# Patient Record
Sex: Female | Born: 1960 | Race: White | Hispanic: No | Marital: Married | Smoking: Never smoker
Health system: Southern US, Community
[De-identification: ages and names within clinical notes are randomized; demographics above are authoritative.]

---

## 2002-03-03 DIAGNOSIS — D229 Melanocytic nevi, unspecified: Secondary | ICD-10-CM

## 2002-03-03 HISTORY — DX: Melanocytic nevi, unspecified: D22.9

## 2013-07-09 DIAGNOSIS — D039 Melanoma in situ, unspecified: Secondary | ICD-10-CM

## 2013-07-09 DIAGNOSIS — C4491 Basal cell carcinoma of skin, unspecified: Secondary | ICD-10-CM

## 2013-07-09 HISTORY — DX: Basal cell carcinoma of skin, unspecified: C44.91

## 2013-07-09 HISTORY — DX: Melanoma in situ, unspecified: D03.9

## 2017-11-16 DIAGNOSIS — C4491 Basal cell carcinoma of skin, unspecified: Secondary | ICD-10-CM

## 2017-11-16 HISTORY — DX: Basal cell carcinoma of skin, unspecified: C44.91

## 2017-12-05 DIAGNOSIS — C4491 Basal cell carcinoma of skin, unspecified: Secondary | ICD-10-CM

## 2017-12-05 HISTORY — DX: Basal cell carcinoma of skin, unspecified: C44.91

## 2018-10-10 ENCOUNTER — Ambulatory Visit (INDEPENDENT_AMBULATORY_CARE_PROVIDER_SITE_OTHER): Payer: BLUE CROSS/BLUE SHIELD | Admitting: Family Medicine

## 2018-10-10 ENCOUNTER — Ambulatory Visit (INDEPENDENT_AMBULATORY_CARE_PROVIDER_SITE_OTHER): Payer: Self-pay

## 2018-10-10 ENCOUNTER — Encounter (INDEPENDENT_AMBULATORY_CARE_PROVIDER_SITE_OTHER): Payer: Self-pay | Admitting: Family Medicine

## 2018-10-10 DIAGNOSIS — M79671 Pain in right foot: Secondary | ICD-10-CM | POA: Diagnosis not present

## 2018-10-10 NOTE — Patient Instructions (Signed)
    Vitamin D3:  5,000 IU daily  Magnesium:  400 mg daily  Vitamin K2:  100 mcg daily

## 2018-10-10 NOTE — Progress Notes (Signed)
Office Visit Note   Patient: Patricia Mcneil           Date of Birth: 01-07-1961           MRN: [id number] Visit Date: 10/10/2018 Requested by: Tillman Abide, [place] [address] PCP: System, Pcp Not In  Subjective: Chief Complaint  Patient presents with  . Right Foot - Pain    Pain lateral aspect of foot - sharp/twisting pain. H/o stress fx 9/19 - wore postop shoe til Thanksgiving. On Thanksgiving an 82-lb dog plopped down on her foot (while her foot was propped up on the arm of the couch).    HPI: She is here with right lateral foot pain.  About 3 years ago without injury, she developed sudden onset of swelling in her right ankle.  This lasted for several months.  She was subsequently diagnosed with neuropathy, etiology uncertain, but she does have B12 deficiency and diabetes which could predispose her to that.  She was placed on Lyrica with some improvement.  Then about 6 or 7 months ago she had a sudden sharp pain on the lateral aspect of her foot after stepping awkwardly.  She went to a podiatrist who thought she might have a stress fracture.  She was placed in a fracture boot for several weeks and her pain improved but never went away completely.  Then around Thanksgiving she had a second episode of sharp pain on the lateral foot.  She went back to her previous doctor and x-rays were equivocal, did not show any significant change from before.  She wore her boot again but her pain has persisted.  Pain remains on the lateral aspect of her foot.  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               ROS: She has diabetes, B12 deficiency, vitamin D deficiency all being treated.  She has had troubles with vertigo.  Other systems were reviewed and are negative as pertains to the chief complaint.  Objective: Vital Signs: There were no vitals taken for this visit.  Physical Exam:  Right foot: No swelling or bruising.  She is able to  dorsiflex, evert and plantarflex the foot against resistance with minimal pain.  She is point tender to palpation of the fifth metatarsal shaft, this seems to reproduce her pain.  No tenderness to palpation of the fifth toe extensor tendon.  No pain with axial load of the metatarsal.  Imaging: X-rays right foot: No obvious bony abnormality at the fifth metatarsal.  She has some mild degenerative changes in her foot.  I did briefly imaged the lateral foot with ultrasound but did not record the images or bill for the procedure.  I believe she has cortical elevation at the area of tenderness which is consistent with a stress reaction.  Assessment & Plan: 1.  Chronic right lateral foot pain suspicious for fifth metatarsal shaft stress fracture -Discussed options with patient and elected to treat with vitamin D3, magnesium, and vitamin K2 in order to facilitate bone healing.  She can wear her boot if needed, but she does not have to. -If not improving after 3 to 4 weeks, she will call me and I will order MRI of her foot to further evaluate. - At some point she may need a bone density test.   Follow-Up Instructions: No follow-ups on file.      Procedures: No procedures performed  No notes  on file    PMFS History: There are no active problems to display for this patient.  History reviewed. No pertinent past medical history.  History reviewed. No pertinent family history.  History reviewed. No pertinent surgical history. Social History   Occupational History  . Not on file  Tobacco Use  . Smoking status: Not on file  Substance and Sexual Activity  . Alcohol use: Not on file  . Drug use: Not on file  . Sexual activity: Not on file

## 2018-10-24 ENCOUNTER — Telehealth (INDEPENDENT_AMBULATORY_CARE_PROVIDER_SITE_OTHER): Payer: Self-pay | Admitting: Family Medicine

## 2018-10-24 DIAGNOSIS — M79671 Pain in right foot: Secondary | ICD-10-CM

## 2018-10-24 NOTE — Telephone Encounter (Signed)
Please advise 

## 2018-10-24 NOTE — Telephone Encounter (Signed)
Patient called advised her foot is not any better. Patient asked if she will be set up for an MRI or Bone Scan. The number to contact patient is [phone] or [phone]  Leave message

## 2018-10-25 NOTE — Telephone Encounter (Signed)
Left full message on the voice mail at the first number in the original message. The MRI facility will be in touch with her to schedule this.

## 2018-10-25 NOTE — Telephone Encounter (Signed)
MRI ordered

## 2018-11-08 ENCOUNTER — Ambulatory Visit
Admission: RE | Admit: 2018-11-08 | Discharge: 2018-11-08 | Disposition: A | Discharge status: Home / Self Care | Payer: BLUE CROSS/BLUE SHIELD | Source: Ambulatory Visit | Attending: Family Medicine | Admitting: Family Medicine

## 2018-11-08 DIAGNOSIS — M79671 Pain in right foot: Secondary | ICD-10-CM

## 2018-11-09 ENCOUNTER — Telehealth (INDEPENDENT_AMBULATORY_CARE_PROVIDER_SITE_OTHER): Payer: Self-pay | Admitting: Family Medicine

## 2018-11-09 NOTE — Telephone Encounter (Signed)
MRI shows a possible stress reaction in the third metatrsal bone of her foot.  This could be the source of her pain.  I think she might benefit from a bone growth stimulator device.  If she's interested in pursuing this, please contact Lytle Michaels regarding getting an Exogen bone growth stimulator for her.  Then after getting started with it, I'll see her back about 6 weeks later to assess progress.

## 2018-11-12 NOTE — Telephone Encounter (Signed)
Left message on patient's home voice mail to call back (did not want to leave a detailed message, as the MRI results/instructions are a bit involved).

## 2018-11-13 ENCOUNTER — Telehealth (INDEPENDENT_AMBULATORY_CARE_PROVIDER_SITE_OTHER): Payer: Self-pay | Admitting: Family Medicine

## 2018-11-13 NOTE — Telephone Encounter (Signed)
Yes to both questions.

## 2018-11-13 NOTE — Telephone Encounter (Signed)
Patient returned call asked for a call back after 3:30pm. The number to contact patient is [phone] or [phone]

## 2018-11-13 NOTE — Telephone Encounter (Signed)
I advised the patient of the option of the shoe or boot. Since I last spoke to her, she did check into the bone growth stimulator and saw the cost is about $6,000 with no assurance that insurance will cover it. She is choosing the fracture boot or post op shoe. Scheduled a nurse only appointment for 11/18/2018 at 1:30 to get this, when she will already be in [place] for the day.

## 2018-11-13 NOTE — Telephone Encounter (Signed)
If she chooses the shoe or boot, does she wear this with all weightbearing activity, and should she try this for 6 weeks as well?

## 2018-11-13 NOTE — Telephone Encounter (Signed)
Yes, could try a post-op shoe or short fracture boot instead if preferred.

## 2018-11-13 NOTE — Telephone Encounter (Signed)
See other message on this from today.

## 2018-11-13 NOTE — Telephone Encounter (Signed)
I advised the patient of her MRI results and plan - she will look up the bone growth stimulator on the internet. She wanted to know if there are other alternatives. She continues to have a lot of pain in that foot - stands all day on her job [employer]). She started out in a postop shoe (before her visit here) but that "just came apart" eventually. So, she wears normal shoes. Should she be back in a postop shoe or boot? She is not opposed to the bone growth stimulator, but wants it only if her insurance will cover it.

## 2018-11-18 ENCOUNTER — Ambulatory Visit (INDEPENDENT_AMBULATORY_CARE_PROVIDER_SITE_OTHER): Payer: BLUE CROSS/BLUE SHIELD

## 2018-11-18 DIAGNOSIS — M84374G Stress fracture, right foot, subsequent encounter for fracture with delayed healing: Secondary | ICD-10-CM

## 2018-11-21 ENCOUNTER — Telehealth (INDEPENDENT_AMBULATORY_CARE_PROVIDER_SITE_OTHER): Payer: Self-pay | Admitting: Family Medicine

## 2018-11-21 NOTE — Telephone Encounter (Signed)
I called and got patient's voice mail. What exactly is going on with the boot - may need to switch it out or try something different.  I did also advise her (in the message) that Dr. Junius Roads is not in the clinic this afternoon and I am only here a little longer today.  Both of [place] will be back in clinic tomorrow, 8-5, if she cannot reach me back before I leave today.

## 2018-11-21 NOTE — Telephone Encounter (Signed)
Follow up ° ° ° ° ° ° ° ° ° °Pt returning nurses call °

## 2018-11-21 NOTE — Telephone Encounter (Signed)
Patient called advised the boot she is wearing is falling apart.  The number to contact patient is [phone]

## 2018-11-22 NOTE — Telephone Encounter (Signed)
I called the patient: the top strap on the boot is coming apart at a seam that was not sewn very well.  I took a good strap from a returned boot downstairs - mailing this to the patient per request.  She will let me know if she has any more problems before her return office visit in 6 weeks.

## 2018-11-22 NOTE — Telephone Encounter (Signed)
Patient returned your call, she is calling about her boot, she said that one of the straps on the boot is breaking and needs to get another one.  The best time to call her is between 12 and 1.  CB#[phone].  Thank you.

## 2018-12-19 ENCOUNTER — Telehealth (INDEPENDENT_AMBULATORY_CARE_PROVIDER_SITE_OTHER): Payer: Self-pay | Admitting: Radiology

## 2018-12-19 NOTE — Telephone Encounter (Signed)
Left message to return call. Please ask screening questions, thanks!  In the last 7 days..... Fever or chills? Cough? Nausea, vomiting, abdominal pain, diarrhea? Exposed to anyone with positive COVID-19? Traveled recently in/out of state?

## 2018-12-23 ENCOUNTER — Ambulatory Visit (INDEPENDENT_AMBULATORY_CARE_PROVIDER_SITE_OTHER): Payer: Self-pay | Admitting: Family Medicine

## 2019-01-13 ENCOUNTER — Ambulatory Visit: Payer: Self-pay | Admitting: Family Medicine

## 2020-03-05 IMAGING — MR MR FOOT*R* W/O CM
4 of 5 series · 30 of 40 positions shown · non-contrast
Comparison: None.

CLINICAL DATA: Foot pain at the top of the foot. History of prior
fracture May 2018.

EXAM:
MRI OF THE RIGHT FOREFOOT WITHOUT CONTRAST
TECHNIQUE: Multiplanar, multisequence MR imaging of the right foot pain was
performed. No intravenous contrast was administered.

[Series 6: T1 · coronal · 3.0mm · 0.38mm/px · 8 of 45 slices shown (1 of 2)]
[im 1/45]
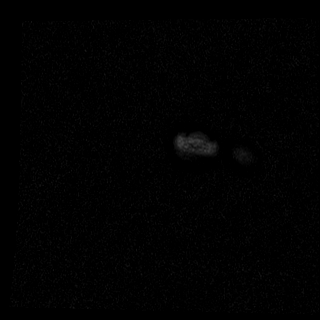
[im 5/45]
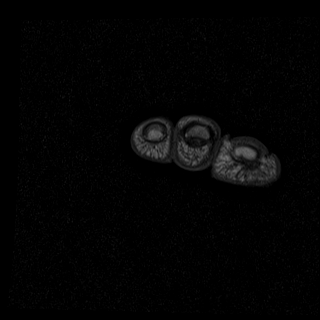
[im 15/45]
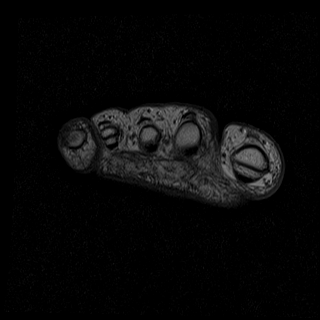
[im 20/45]
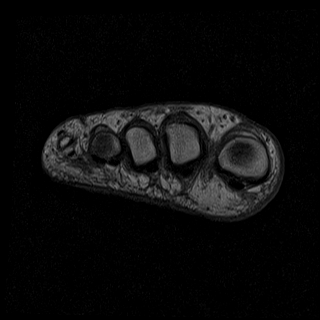
[im 25/45]
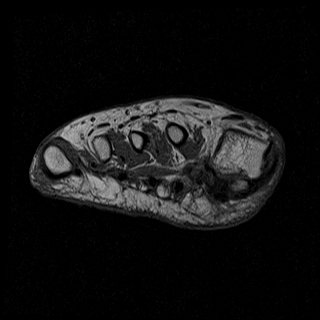
[im 30/45]
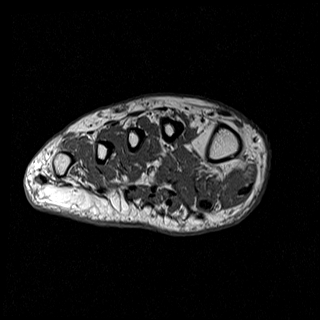
[im 40/45]
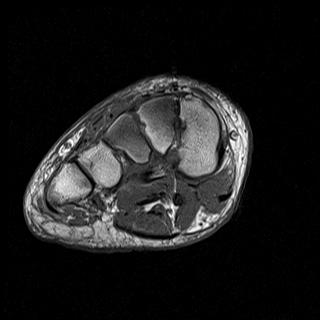
[im 45/45]
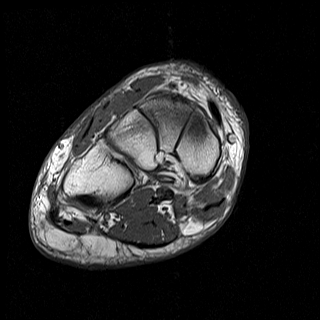

[Series 7: T2 fat-sat · coronal · 3.0mm · 0.38mm/px · 11 of 45 slices shown (1 of 2)]
[im 1/45]
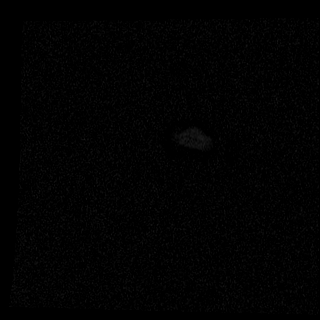
[im 5/45]
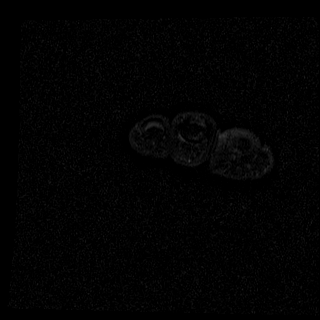
[im 9/45]
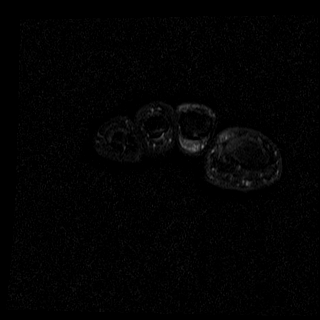
[im 14/45]
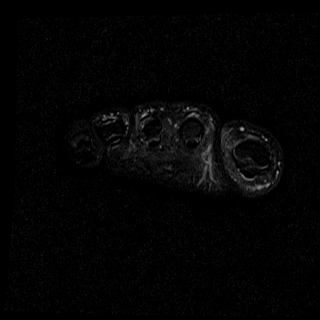
[im 18/45]
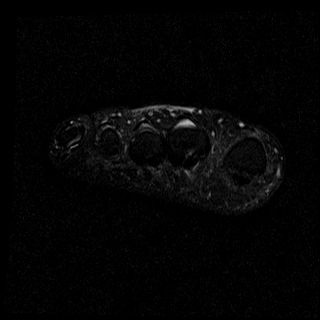
[im 23/45]
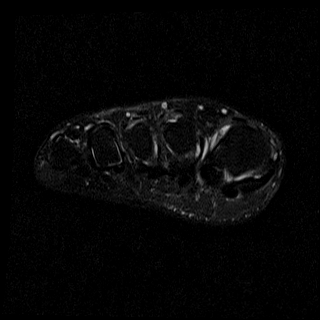
[im 27/45]
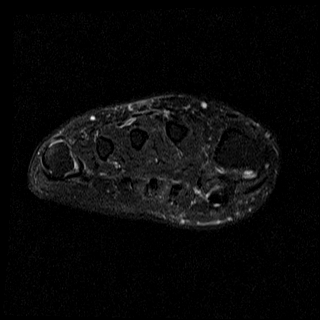
[im 31/45]
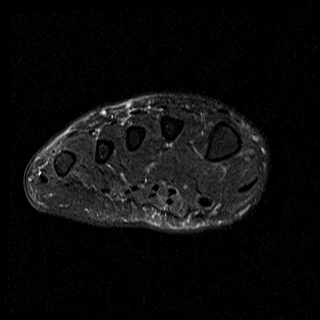
[im 36/45]
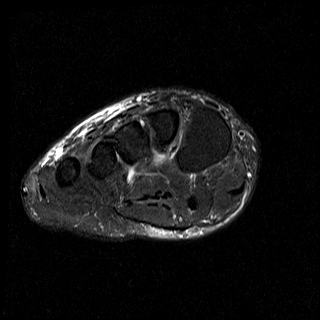
[im 40/45]
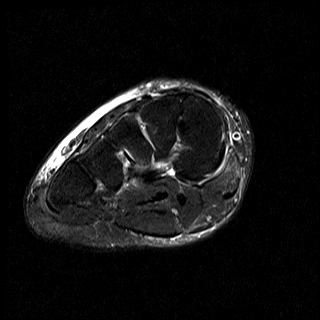
[im 45/45]
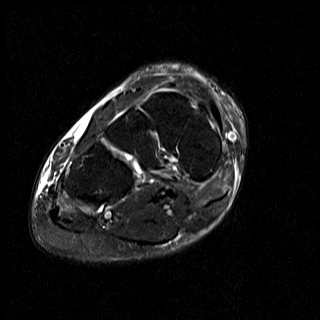

[Series 8: T1 · axial · 3.0mm · 0.35mm/px · z∈[-176,-104]mm · 5 of 24 slices shown (2 of 2)]
[im 1/24]
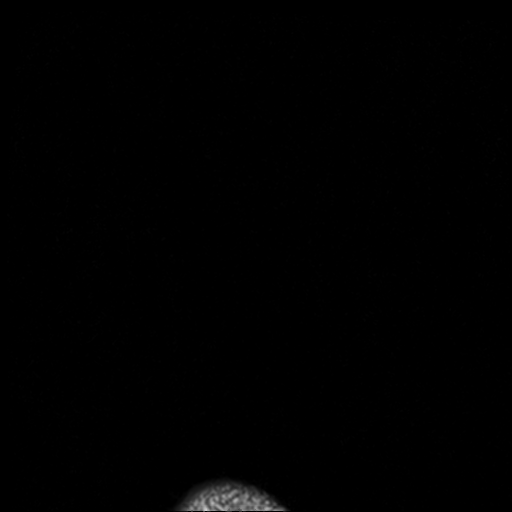
[im 5/24]
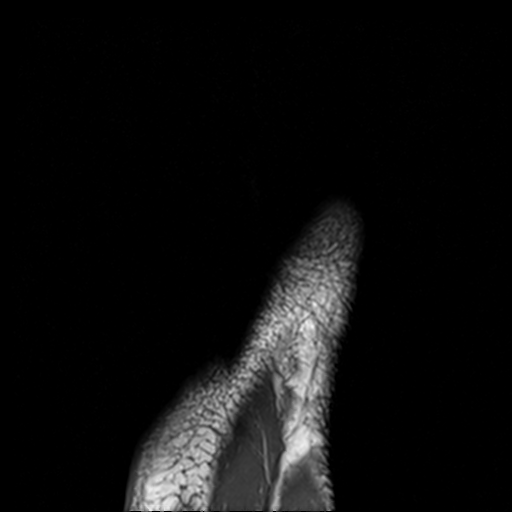
[im 10/24]
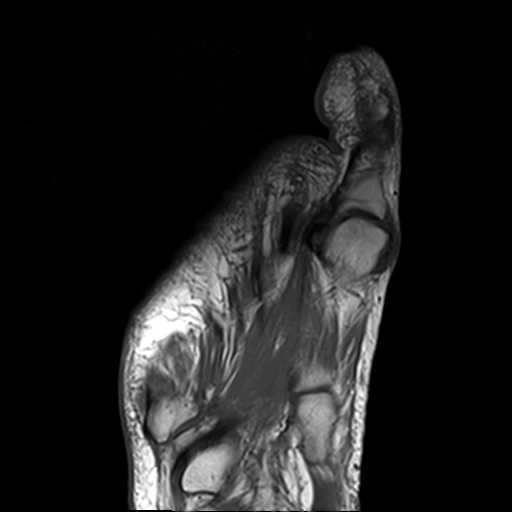
[im 14/24]
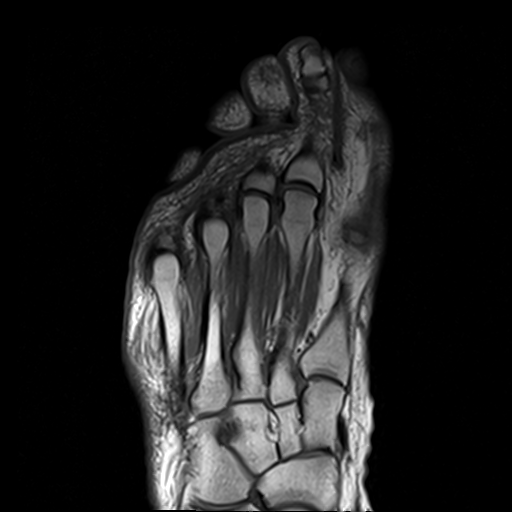
[im 24/24]
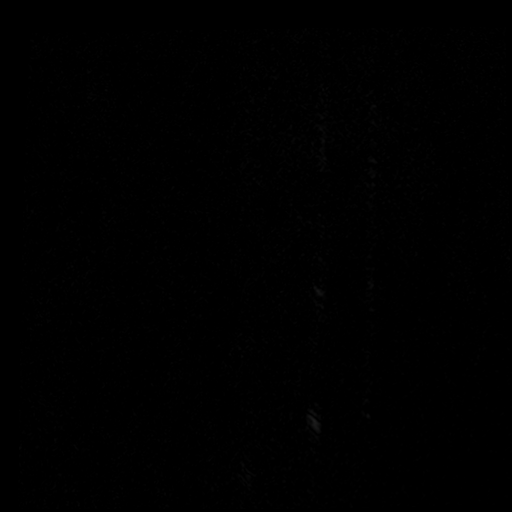

[Series 9: T2 fat-sat · axial · 3.0mm · 0.35mm/px · z∈[-176,-104]mm · 6 of 24 slices shown (2 of 2)]
[im 1/24]
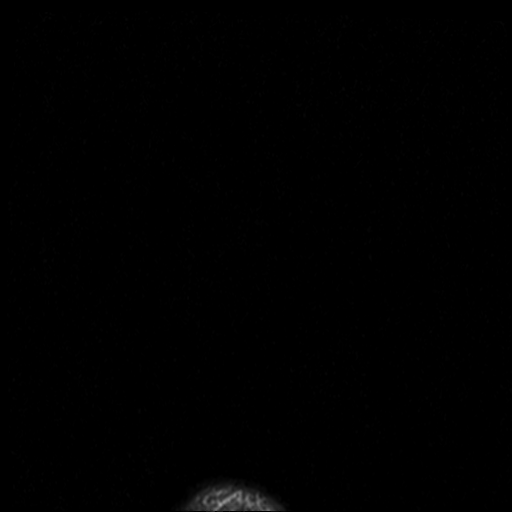
[im 5/24]
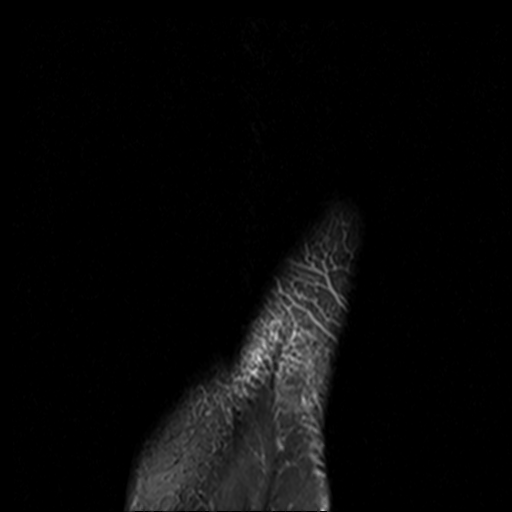
[im 10/24]
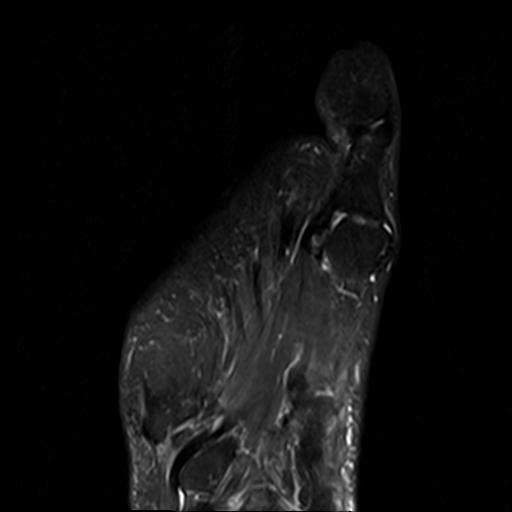
[im 14/24]
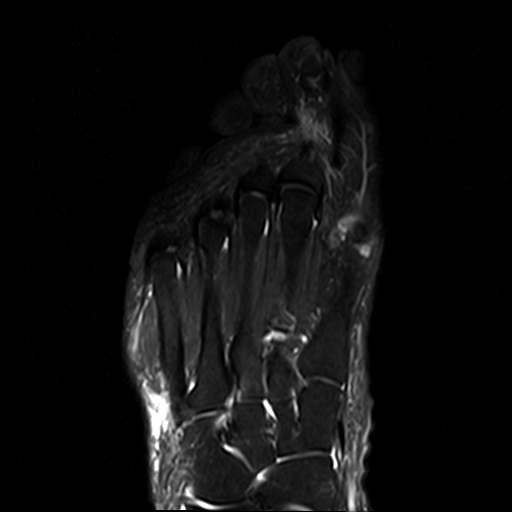
[im 19/24]
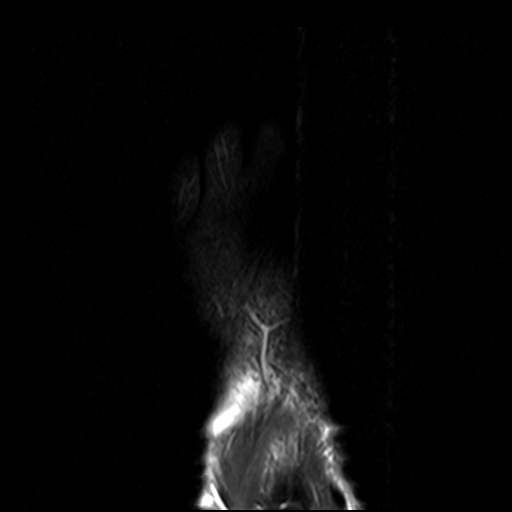
[im 24/24]
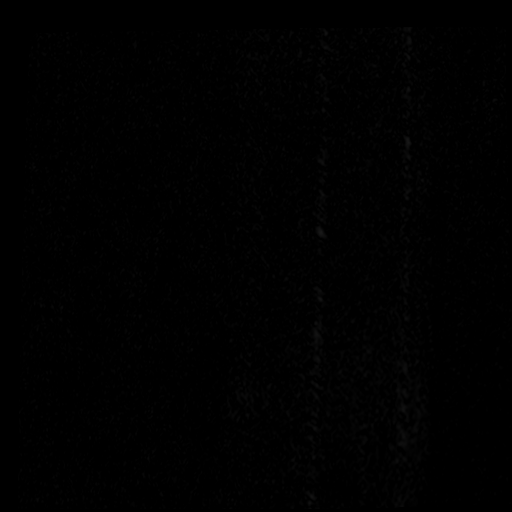

[30 of 40 positions shown; findings below may reference images not displayed]

FINDINGS: Bones/Joint/Cartilage

Mild marrow edema at the base of the third metatarsal concerning for
mild stress reaction without a linear component to suggest a
fracture. No fracture or dislocation. Normal alignment. No joint
effusion. No periosteal reaction or bone destruction.

Ligaments

Collateral ligaments are intact.  Lisfranc ligament is intact.

Muscles and Tendons
Flexor, peroneal and extensor compartment tendons are intact.
Minimal muscle edema in the abductor hallucis muscle.

Soft tissue
No fluid collection or hematoma.  No soft tissue mass.
IMPRESSION: 1. Mild marrow edema at the base of the third metatarsal concerning
for mild stress reaction without a fracture.
2. Minimal muscle edema in the abductor hallucis muscle most
consistent with mild muscle strain.

## 2022-02-14 ENCOUNTER — Encounter: Payer: Self-pay | Admitting: Physician Assistant

## 2022-02-14 ENCOUNTER — Ambulatory Visit (INDEPENDENT_AMBULATORY_CARE_PROVIDER_SITE_OTHER): Payer: BC Managed Care – PPO | Admitting: Physician Assistant

## 2022-02-14 DIAGNOSIS — Z1283 Encounter for screening for malignant neoplasm of skin: Secondary | ICD-10-CM | POA: Diagnosis not present

## 2022-02-14 DIAGNOSIS — L820 Inflamed seborrheic keratosis: Secondary | ICD-10-CM

## 2022-02-14 DIAGNOSIS — Z85820 Personal history of malignant melanoma of skin: Secondary | ICD-10-CM | POA: Diagnosis not present

## 2022-02-20 ENCOUNTER — Encounter: Payer: Self-pay | Admitting: Physician Assistant

## 2022-02-20 NOTE — Progress Notes (Signed)
   Follow-Up Visit   Subjective  Patricia Mcneil is a 61 y.o. female who presents for the following: Annual Exam (Patient here today for skin check, per patient she has a lesions on her right cheek x unsure that she does pick some bleeding with picking. Check lesion on left eyelid x 1 year per patient getting larger, no bleeding. Check lesion behind right ear and on left ear x years no bleeding. Lesion near previous [place] site on right shoulder x months no bleeding. Check lesion on left thigh x months no bleeding. Personal history of melanoma and non mole skin cancer. Family history of melanoma (mother)).   The following portions of the chart were reviewed this encounter and updated as appropriate:  Tobacco  Allergies  Meds  Problems  Med Hx  Surg Hx  Fam Hx      Objective  Well appearing patient in no apparent distress; mood and affect are within normal limits.  A full examination was performed including scalp, head, eyes, ears, nose, lips, neck, chest, axillae, abdomen, back, buttocks, bilateral upper extremities, bilateral lower extremities, hands, feet, fingers, toes, fingernails, and toenails. All findings within normal limits unless otherwise noted below.  Full body skin examination- No atypical nevi or signs of NMSC noted at the time of the visit.   Left anterior shoulder White scar- clear  Neck - Posterior Stuck-on, crusted plaque.    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  The documentation on 02/20/22 for the exam, diagnosis, procedures and orders are all accurate and complete.
